# Patient Record
Sex: Female | Born: 2000 | Race: Black or African American | Hispanic: No | Marital: Married | Smoking: Never smoker
Health system: Southern US, Community
[De-identification: ages and names within clinical notes are randomized; demographics above are authoritative.]

## PROBLEM LIST (undated history)

## (undated) DIAGNOSIS — E119 Type 2 diabetes mellitus without complications: Secondary | ICD-10-CM

---
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
## 2019-04-16 ENCOUNTER — Emergency Department (HOSPITAL_COMMUNITY)
Admission: EM | Admit: 2019-04-16 | Discharge: 2019-04-16 | Disposition: A | Discharge status: Home / Self Care | Payer: Self-pay | Attending: Emergency Medicine | Admitting: Emergency Medicine

## 2019-04-16 ENCOUNTER — Other Ambulatory Visit: Payer: Self-pay

## 2019-04-16 ENCOUNTER — Encounter (HOSPITAL_COMMUNITY): Payer: Self-pay

## 2019-04-16 DIAGNOSIS — R102 Pelvic and perineal pain: Secondary | ICD-10-CM | POA: Insufficient documentation

## 2019-04-16 LAB — CBC WITH DIFFERENTIAL/PLATELET
Abs Immature Granulocytes: 0.02 10*3/uL (ref 0.00–0.07)
Basophils Absolute: 0 10*3/uL (ref 0.0–0.1)
Basophils Relative: 1 %
Eosinophils Absolute: 0.2 10*3/uL (ref 0.0–0.5)
Eosinophils Relative: 2 %
HCT: 43.1 % (ref 36.0–46.0)
Hemoglobin: 14.1 g/dL (ref 12.0–15.0)
Immature Granulocytes: 0 %
Lymphocytes Relative: 38 %
Lymphs Abs: 2.8 10*3/uL (ref 0.7–4.0)
MCH: 30 pg (ref 26.0–34.0)
MCHC: 32.7 g/dL (ref 30.0–36.0)
MCV: 91.7 fL (ref 80.0–100.0)
Monocytes Absolute: 0.7 10*3/uL (ref 0.1–1.0)
Monocytes Relative: 10 %
Neutro Abs: 3.5 10*3/uL (ref 1.7–7.7)
Neutrophils Relative %: 49 %
Platelets: 414 10*3/uL — ABNORMAL HIGH (ref 150–400)
RBC: 4.7 MIL/uL (ref 3.87–5.11)
RDW: 13 % (ref 11.5–15.5)
WBC: 7.2 10*3/uL (ref 4.0–10.5)
nRBC: 0 % (ref 0.0–0.2)

## 2019-04-16 LAB — COMPREHENSIVE METABOLIC PANEL
ALT: 13 U/L (ref 0–44)
AST: 20 U/L (ref 15–41)
Albumin: 4.6 g/dL (ref 3.5–5.0)
Alkaline Phosphatase: 63 U/L (ref 38–126)
Anion gap: 9 (ref 5–15)
BUN: 12 mg/dL (ref 6–20)
CO2: 23 mmol/L (ref 22–32)
Calcium: 9.8 mg/dL (ref 8.9–10.3)
Chloride: 105 mmol/L (ref 98–111)
Creatinine, Ser: 0.7 mg/dL (ref 0.44–1.00)
GFR calc Af Amer: >60 mL/min (ref >60)
GFR calc non Af Amer: >60 mL/min (ref >60)
Glucose, Bld: 95 mg/dL (ref 70–99)
Potassium: 3.8 mmol/L (ref 3.5–5.1)
Sodium: 137 mmol/L (ref 135–145)
Total Bilirubin: 0.3 mg/dL (ref 0.3–1.2)
Total Protein: 7.9 g/dL (ref 6.5–8.1)

## 2019-04-16 LAB — URINALYSIS, ROUTINE W REFLEX MICROSCOPIC
Bilirubin Urine: NEGATIVE
Glucose, UA: NEGATIVE mg/dL
Hgb urine dipstick: NEGATIVE
Ketones, ur: NEGATIVE mg/dL
Leukocytes,Ua: NEGATIVE
Nitrite: NEGATIVE
Protein, ur: NEGATIVE mg/dL
Specific Gravity, Urine: 1.025 (ref 1.005–1.030)
pH: 7 (ref 5.0–8.0)

## 2019-04-16 LAB — I-STAT BETA HCG BLOOD, ED (MC, WL, AP ONLY): I-stat hCG, quantitative: <5 m[IU]/mL (ref <5)

## 2019-04-16 NOTE — Discharge Instructions (Signed)
You have been seen today for pelvic pain. Please read and follow all provided instructions. Return to the emergency room for worsening condition or new concerning symptoms.    1. Medications:  No new medications were prescribed.  Recommend you take Tylenol and ibuprofen as needed for pain.  Ibuprofen is helpful with menstrual cramps  Continue usual home medications Take medications as prescribed. Please review all of the medicines and only take them if you do not have an allergy to them.   2. Treatment: rest, drink plenty of fluids  3. Follow Up:  Please follow up the [facility] to establish care with a primary care doctor.  -Also recommend you follow-up with the women's clinic to discuss your irregular periods.  The contact information is included in your paperwork. Please call office to schedule appointment.   It is also a possibility that you have an allergic reaction to any of the medicines that you have been prescribed - Everybody reacts differently to medications and while MOST people have no trouble with most medicines, you may have a reaction such as nausea, vomiting, rash, swelling, shortness of breath. If this is the case, please stop taking the medicine immediately and contact your physician.  ?

## 2019-04-16 NOTE — ED Triage Notes (Signed)
Patient states she is here because she is having irregular periods and having cramping in pelvic region. Patient states she got period in December and January but missed February and now has missed march. Patient denies possibility of being pregnant.

## 2019-04-16 NOTE — ED Provider Notes (Signed)
[facility]-EMERGENCY DEPT Provider Note   CSN: [id number] Arrival date & time: 04/16/19  1704     History Chief Complaint  Patient presents with  . Pelvic Pain    Denise Woodard is a 19 y.o. female presents to emergency department today with chief complaint of intermittent pelvic pain x 2 months.  Patient states she has been having irregular periods. She did not have cycle in December. LMP was January 13 and it was normal for her. She then missed cycle in February and has not yet has cycle this month (March). She endorses clear vaginal discharge which she reports is normal for her, unchanged. She is endorsing what feels like her typical menstrual cramps, but without actual cycle. She is sexually active with one female partner without protection. Denies history of STDs. She denies fever, chills, chest pain, shortness of breath, abnormal vaginal bleeding, diarrhea, pruritis, nausea, vomiting, urinary frequency, dysuria, diarrhea.  History provided by patient with additional history obtained from chart review.     No past medical history on file.  There are no problems to display for this patient.     OB History   No obstetric history on file.     No family history on file.  Social History   Tobacco Use  . Smoking status: Never Smoker  . Smokeless tobacco: Never Used  Substance Use Topics  . Alcohol use: Never  . Drug use: Never    Home Medications Prior to Admission medications   Not on File    Allergies    Amoxicillin and Penicillins  Review of Systems   Review of Systems  All other systems are reviewed and are negative for acute change except as noted in the HPI.   Physical Exam Updated Vital Signs BP (!) 161/82 (BP Location: Left Arm)   Pulse 82   Temp 99 F (37.2 C) (Oral)   Resp 16   Ht 5\' 5"  (1.651 m)   Wt 81.6 kg   LMP 02/16/2019   SpO2 100%   BMI 29.95 kg/m   Physical Exam Vitals and nursing note reviewed.    Constitutional:      General: She is not in acute distress.    Appearance: She is not ill-appearing.  HENT:     Head: Normocephalic and atraumatic.     Right Ear: Tympanic membrane and external ear normal.     Left Ear: Tympanic membrane and external ear normal.     Nose: Nose normal.     Mouth/Throat:     Mouth: Mucous membranes are moist.     Pharynx: Oropharynx is clear.  Eyes:     General: No scleral icterus.       Right eye: No discharge.        Left eye: No discharge.     Extraocular Movements: Extraocular movements intact.     Conjunctiva/sclera: Conjunctivae normal.     Pupils: Pupils are equal, round, and reactive to light.  Neck:     Vascular: No JVD.  Cardiovascular:     Rate and Rhythm: Normal rate and regular rhythm.     Pulses: Normal pulses.          Radial pulses are 2+ on the right side and 2+ on the left side.     Heart sounds: Normal heart sounds.  Pulmonary:     Comments: Lungs clear to auscultation in all fields. Symmetric chest rise. No wheezing, rales, or rhonchi. Abdominal:     General: Bowel  sounds are normal.     Tenderness: There is no left CVA tenderness.     Comments: Abdomen is soft, non-distended. Mild tenderness to suprapubic region. No rigidity, no guarding. No peritoneal signs.  Genitourinary:    Comments: Patient defers pelvic exam Musculoskeletal:        General: Normal range of motion.     Cervical back: Normal range of motion.  Skin:    General: Skin is warm and dry.     Capillary Refill: Capillary refill takes less than 2 seconds.  Neurological:     Mental Status: She is oriented to person, place, and time.     GCS: GCS eye subscore is 4. GCS verbal subscore is 5. GCS motor subscore is 6.     Comments: Fluent speech, no facial droop.  Psychiatric:        Mood and Affect: Mood is anxious.        Behavior: Behavior normal.     ED Results / Procedures / Treatments   Labs (all labs ordered are listed, but only abnormal results  are displayed) Labs Reviewed  CBC WITH DIFFERENTIAL/PLATELET - Abnormal; Notable for the following components:      Result Value   Platelets 414 (*)    All other components within normal limits  URINALYSIS, ROUTINE W REFLEX MICROSCOPIC - Abnormal; Notable for the following components:   APPearance CLOUDY (*)    All other components within normal limits  COMPREHENSIVE METABOLIC PANEL  I-STAT BETA HCG BLOOD, ED ([facility], [facility], [facility] ONLY)    EKG None  Radiology No results found.  Procedures Procedures (including critical care time)  Medications Ordered in ED Medications - No data to display  ED Course  I have reviewed the triage vital signs and the nursing notes.  Pertinent labs & imaging results that were available during my care of the patient were reviewed by me and considered in my medical decision making (see chart for details).    MDM Rules/Calculators/A&P                     Patient seen and examined. Patient presents awake, alert, hemodynamically stable, afebrile, non toxic.  No tachycardia or hypoxia.  On exam she has mild tenderness to suprapubic area, no peritoneal signs.  No CVA tenderness.   Labs here are unremarkable.  No leukocytosis, no anemia, no severe electrolyte derangement, normal renal function, normal liver enzymes.  UA is negative for infection.  Pregnancy test is negative. Patient defers pelvic exam. She has never had pelvic exam before. Discussed that with her complaint of pelvic pain it is reasonable to do pelvic exam and STD testing. Patient refuses and would rather follow up outpatient with pcp. I discussed risks of not having pelvic exam and untreated STDs. She then admits to coming to emergency department for pregnancy test.   The patient appears reasonably screened and/or stabilized for discharge and I doubt any other medical condition or other [facility] requiring further screening, evaluation, or treatment in the ED at this time prior to discharge. The patient is  safe for discharge with strict return precautions discussed. Recommend pcp follow up. Also given information for outpatient womens clinic for follow up.   Portions of this note were generated with Scientist, clinical (histocompatibility and immunogenetics). Dictation errors may occur despite best attempts at proofreading.    Final Clinical Impression(s) / ED Diagnoses Final diagnoses:  Pelvic pain in female    Rx / DC Orders ED Discharge Orders  None       Flint Melter 04/16/19 Tomasita Morrow, MD 04/17/19 1209

## 2019-09-22 ENCOUNTER — Other Ambulatory Visit: Payer: Self-pay

## 2019-09-22 ENCOUNTER — Emergency Department (HOSPITAL_COMMUNITY)
Admission: EM | Admit: 2019-09-22 | Discharge: 2019-09-23 | Disposition: A | Discharge status: Home / Self Care | Payer: Medicaid Other | Attending: Emergency Medicine | Admitting: Emergency Medicine

## 2019-09-22 DIAGNOSIS — Z3A08 8 weeks gestation of pregnancy: Secondary | ICD-10-CM | POA: Insufficient documentation

## 2019-09-22 DIAGNOSIS — O021 Missed abortion: Secondary | ICD-10-CM | POA: Insufficient documentation

## 2019-09-22 DIAGNOSIS — Z5321 Procedure and treatment not carried out due to patient leaving prior to being seen by health care provider: Secondary | ICD-10-CM | POA: Diagnosis not present

## 2019-09-22 NOTE — ED Triage Notes (Signed)
Pt 8 weeks preg on 8/16 was told today via vaginal ultrasound that there is not heart beat. Pregnancy network is seeing this patient and is supposed to be in contact within 24 hrs.She states "I am nervous about walking around with a dead baby inside of me"  PA Jeraldine Loots to assess at triage.

## 2019-09-22 NOTE — ED Triage Notes (Signed)
Emergency Medicine Provider OB Triage Evaluation Note  Denise Woodard is a 19 y.o. female, No obstetric history on file., at Unknown gestation who presents to the emergency department with complaints of concerns for abnormal ultrasound.  She reports that today she had a vaginal ultrasound with a pregnancy network of [place].  First day of her LMP was 4/14.  She is G1, P0.  She reports that at the ultrasound they saw a baby present however noted that there was no heartbeat.  She states that she was told they would contact her within 24 hours to set up follow-up appointments as an outpatient however she is very concerned that she is.  "Walking around with a dead baby inside of me."  She denies any pelvic pain, vaginal discharge, spotting, or bleeding.  No fevers.  No nausea vomiting diarrhea and is entirely asymptomatic aside from being appropriately emotionally upset.   Review of  Systems  Positive: None Negative: No vaginal cramping, spotting or bleeding.  No fevers.  Physical Exam  BP (!) 157/124    Pulse (!) 103    Temp 98.8 F (37.1 C)    Resp 19    SpO2 100%  General: Awake, appropriately tearful, anxious HEENT: Atraumatic  Resp: Normal effort  Cardiac: Normal rate Abd: Nondistended, nontender  MSK: Moves all extremities without difficulty Neuro: Speech clear  Medical Decision Making  Pt evaluated for pregnancy concern and is stable for transfer to [facility]. Pt is in agreement with plan for transfer.  9:24 PM Discussed with [facility] APP, Joni Reining, who reccomends patient follow up as an outpatient stating there is no emergent complaint.  She [facility] to follow-up on abnormal ultrasound. Recommends patient be discharged with information for the outpatient clinic.   I disucssed with patient that [facility] is not accepting her at this time.  I recommended that she stay for complete evaluation in the ER as this triage amazing  exam is not a substitute for a full formal evaluation and we discussed risks of leaving before full formal evaluation is complete.  She stated her understanding.    Clinical Impression  No diagnosis found.     Cristina Gong, [place] 09/22/19 2125

## 2019-09-23 ENCOUNTER — Other Ambulatory Visit: Payer: Self-pay

## 2019-09-23 ENCOUNTER — Telehealth: Payer: Self-pay

## 2019-09-23 DIAGNOSIS — O3680X1 Pregnancy with inconclusive fetal viability, fetus 1: Secondary | ICD-10-CM

## 2019-09-23 NOTE — Telephone Encounter (Signed)
error 

## 2019-09-23 NOTE — Telephone Encounter (Signed)
Patient states she is about [redacted] weeks pregnant. States she had irregular periods and the start of the last LMP was 05/18/2019. States she went to the pregnancy network center states her mom told her to go to the ER because she did not need to be walking around with a "dead baby" in her. Advised that this is not an urgent matter and that we will be taking care of things for her and this will take some time to access her needs and that is starting her off with an ultrasound. She verbalized understanding and I advised I would call her back once I got it all figured out for her.

## 2019-09-26 ENCOUNTER — Other Ambulatory Visit: Payer: Self-pay

## 2019-09-26 ENCOUNTER — Ambulatory Visit
Admission: RE | Admit: 2019-09-26 | Discharge: 2019-09-26 | Disposition: A | Discharge status: Home / Self Care | Payer: Medicaid Other | Source: Ambulatory Visit | Attending: Family Medicine | Admitting: Family Medicine

## 2019-09-26 ENCOUNTER — Ambulatory Visit (INDEPENDENT_AMBULATORY_CARE_PROVIDER_SITE_OTHER): Payer: Self-pay | Admitting: Family Medicine

## 2019-09-26 VITALS — BP 157/81 | HR 93 | Ht 65.0 in | Wt 204.0 lb

## 2019-09-26 DIAGNOSIS — O3680X1 Pregnancy with inconclusive fetal viability, fetus 1: Secondary | ICD-10-CM | POA: Diagnosis not present

## 2019-09-26 DIAGNOSIS — O021 Missed abortion: Secondary | ICD-10-CM

## 2019-09-26 MED ORDER — OXYCODONE-ACETAMINOPHEN 5-325 MG PO TABS: 1.0000 | ORAL_TABLET | ORAL | 0 refills | Status: DC | PRN

## 2019-09-26 MED ORDER — MISOPROSTOL 200 MCG PO TABS: ORAL_TABLET | ORAL | 1 refills | Status: DC

## 2019-09-26 NOTE — Progress Notes (Signed)
   Subjective:    Patient ID: Denise Woodard, female    DOB: Jan 21, 2001, 19 y.o.   MRN: [id number]  HPI Patient seen for [redacted]w[redacted]d MAB. No bleeding, spotting, cramping. This was a planned pregnancy.   Review of Systems     Objective:   Physical Exam Vitals reviewed.  Constitutional:      Appearance: Normal appearance.  HENT:     Head: Normocephalic and atraumatic.  Abdominal:     General: Abdomen is flat.  Skin:    Capillary Refill: Capillary refill takes less than 2 seconds.  Neurological:     Mental Status: She is alert.  Psychiatric:        Mood and Affect: Mood normal.        Behavior: Behavior normal.        Thought Content: Thought content normal.        Judgment: Judgment normal.       Assessment & Plan:  1. Missed ab [redacted]w[redacted]d MAB. Discussed options. Will give cytotec and percocet. Discussed risks, pain/cramping at home. Repeat once in 48 hours if doesn't pass tissue. F/u in 2 weeks.

## 2019-10-12 ENCOUNTER — Ambulatory Visit: Payer: Self-pay | Admitting: Obstetrics and Gynecology

## 2019-10-13 ENCOUNTER — Telehealth: Payer: Self-pay | Admitting: Family Medicine

## 2019-10-13 NOTE — Telephone Encounter (Signed)
Attempted to reschedule patient appointment from 09/08 because of power failure. 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 When given an appointment she suggested her appointment be canceled. She stated she no longer felt the need to be seen anyway.

## 2020-06-03 ENCOUNTER — Encounter (HOSPITAL_COMMUNITY): Payer: Self-pay | Admitting: Emergency Medicine

## 2020-06-03 ENCOUNTER — Other Ambulatory Visit: Payer: Self-pay

## 2020-06-03 ENCOUNTER — Emergency Department (HOSPITAL_COMMUNITY)
Admission: EM | Admit: 2020-06-03 | Discharge: 2020-06-03 | Disposition: A | Discharge status: Home / Self Care | Payer: Medicaid Other | Attending: Emergency Medicine | Admitting: Emergency Medicine

## 2020-06-03 DIAGNOSIS — I10 Essential (primary) hypertension: Secondary | ICD-10-CM | POA: Diagnosis not present

## 2020-06-03 DIAGNOSIS — J029 Acute pharyngitis, unspecified: Secondary | ICD-10-CM | POA: Insufficient documentation

## 2020-06-03 DIAGNOSIS — N939 Abnormal uterine and vaginal bleeding, unspecified: Secondary | ICD-10-CM | POA: Insufficient documentation

## 2020-06-03 DIAGNOSIS — R509 Fever, unspecified: Secondary | ICD-10-CM

## 2020-06-03 DIAGNOSIS — R070 Pain in throat: Secondary | ICD-10-CM | POA: Diagnosis present

## 2020-06-03 DIAGNOSIS — Z20822 Contact with and (suspected) exposure to covid-19: Secondary | ICD-10-CM | POA: Diagnosis not present

## 2020-06-03 DIAGNOSIS — E119 Type 2 diabetes mellitus without complications: Secondary | ICD-10-CM | POA: Insufficient documentation

## 2020-06-03 HISTORY — DX: Type 2 diabetes mellitus without complications: E11.9

## 2020-06-03 LAB — URINALYSIS, ROUTINE W REFLEX MICROSCOPIC
Bilirubin Urine: NEGATIVE
Glucose, UA: NEGATIVE mg/dL
Ketones, ur: NEGATIVE mg/dL
Leukocytes,Ua: NEGATIVE
Nitrite: NEGATIVE
Protein, ur: 100 mg/dL — AB
RBC / HPF: >50 RBC/hpf — ABNORMAL HIGH (ref 0–5)
Specific Gravity, Urine: 1.027 (ref 1.005–1.030)
pH: 5 (ref 5.0–8.0)

## 2020-06-03 LAB — RESP PANEL BY RT-PCR (FLU A&B, COVID) ARPGX2
Influenza A by PCR: NEGATIVE
Influenza B by PCR: NEGATIVE
SARS Coronavirus 2 by RT PCR: NEGATIVE

## 2020-06-03 LAB — PREGNANCY, URINE: Preg Test, Ur: NEGATIVE

## 2020-06-03 LAB — GROUP A STREP BY PCR: Group A Strep by PCR: NOT DETECTED

## 2020-06-03 MED ORDER — IBUPROFEN 600 MG PO TABS: 600.0000 mg | ORAL_TABLET | Freq: Four times a day (QID) | ORAL | 0 refills | Status: DC | PRN

## 2020-06-03 MED ORDER — ACETAMINOPHEN 325 MG PO TABS
650.0000 mg | ORAL_TABLET | Freq: Once | ORAL | Status: AC
Start: 2020-06-03 — End: 2020-06-03
  Administered 2020-06-03: 650 mg via ORAL
  Filled 2020-06-03: qty 2

## 2020-06-03 MED ORDER — CLINDAMYCIN HCL 150 MG PO CAPS: 300.0000 mg | ORAL_CAPSULE | Freq: Three times a day (TID) | ORAL | 0 refills | Status: AC

## 2020-06-03 MED ORDER — IBUPROFEN 800 MG PO TABS
800.0000 mg | ORAL_TABLET | Freq: Once | ORAL | Status: AC
  Administered 2020-06-03: 800 mg via ORAL
  Filled 2020-06-03: qty 1

## 2020-06-03 MED ORDER — ACETAMINOPHEN 325 MG PO TABS: 650.0000 mg | ORAL_TABLET | Freq: Four times a day (QID) | ORAL | 0 refills | Status: AC | PRN

## 2020-06-03 NOTE — ED Triage Notes (Signed)
Emergency Medicine Provider Triage Evaluation Note  Denise Woodard , a 20 y.o. female  was evaluated in triage.  Pt complains of sore throat, fever, vaginal bleeding x 2 days. Tmax 101, took tylenol yesterday. LMP 05/14/20.  Had abdominal cramping yesterday, none today.  Review of Systems  Positive: Sore throat, fever, vaginal bleeding Negative: Cough, congestion, urinary symptoms, vaginal discharge  Physical Exam  BP (!) 152/109   Pulse (!) 117   Temp (!) 101.3 F (38.5 C) (Oral)   Resp 18   LMP 05/14/2020   SpO2 95%  Gen:   Awake, no distress   HEENT:  Atraumatic. Bilateral tonsillar exudate with mild Resp:  Normal effort  Cardiac:  tachycardic Abd:   Nondistended, nontender  MSK:   Moves extremities without difficulty  Neuro:  Speech clear   Medical Decision Making  Medically screening exam initiated at 1:15 PM.  Appropriate orders placed.  Arthur Aydelotte was informed that the remainder of the evaluation will be completed by another provider, this initial triage assessment does not replace that evaluation, and the importance of remaining in the ED until their evaluation is complete.  Clinical Impression  Fever, vaginal bleeding, sore throat. covid swab and strep ordered as well as urine preg and UA. Tylenol ordered.   Shanon Ace, PA-C 06/03/20 1318

## 2020-06-03 NOTE — Discharge Instructions (Signed)
You may be having a virus causing your symptoms.  I would advise that you use Tylenol and Motrin as prescribed for your pain and sore throat.  If you are not feeling better in 5 days, you can begin taking the antibiotic that I prescribed.  This is a 10-day course that will treat strep throat (bacterial).  We talked about your vaginal bleeding.  You did not want a pelvic exam at this time to look for signs of infection. I advised that you follow-up with your OB/GYN doctor for this issue.

## 2020-06-03 NOTE — ED Notes (Signed)
Provider at the bedside to evaluate. 

## 2020-06-03 NOTE — ED Triage Notes (Signed)
Patient reports fever and sore throat since attending a party x2 days ago. Also c/o irregular vaginal bleeding x2 days. States LMP 4/11.

## 2020-06-03 NOTE — ED Provider Notes (Signed)
[facility]-EMERGENCY DEPT Provider Note   CSN: [id number] Arrival date & time: 06/03/20  1217     History Chief Complaint  Patient presents with  . Fever  . Sore Throat  . Vaginal Bleeding    Denise Woodard is a 20 y.o. female with a history of obesity, diabetes, hypertension, presented to emergency department sore throat, fever, headache for 2 days.  She also reports a dry cough.  She separately reports that she has had some vaginal bleeding and spotting intermittently, with her last menstrual period 3 weeks ago.  She says she has irregular menses.  She had birth control place for 2 days "a ring in my vagina," but removed it after 2 days.  She wonders whether this may be triggering some bleeding.  She adamantly denies vaginal discharge.  She states she was recently tested for STIs and these were all negative.  She does not want a pelvic exam today.  HPI     Past Medical History:  Diagnosis Date  . Diabetes mellitus without complication (HCC)     There are no problems to display for this patient.   History reviewed. No pertinent surgical history.   OB History   No obstetric history on file.     No family history on file.  Social History   Tobacco Use  . Smoking status: Never Smoker  . Smokeless tobacco: Never Used  Vaping Use  . Vaping Use: Never used  Substance Use Topics  . Alcohol use: Never  . Drug use: Never    Home Medications Prior to Admission medications   Medication Sig Start Date End Date Taking? Authorizing Provider  acetaminophen (TYLENOL) 325 MG tablet Take 2 tablets (650 mg total) by mouth every 6 (six) hours as needed for up to 30 doses for moderate pain or headache. 06/03/20  Yes Alger Kerstein, Kermit Balo, MD  clindamycin (CLEOCIN) 150 MG capsule Take 2 capsules (300 mg total) by mouth 3 (three) times daily for 10 days. 06/03/20 06/13/20 Yes Kristee Angus, Kermit Balo, MD  ibuprofen (ADVIL) 600 MG tablet Take 1 tablet (600 mg total) by mouth every  6 (six) hours as needed for up to 30 doses for mild pain or moderate pain. 06/03/20  Yes Terald Sleeper, MD  misoprostol (CYTOTEC) 200 MCG tablet Place four tablets in between your gums and cheeks (two tablets on each side) as instructed 09/26/19   Levie Heritage, DO  oxyCODONE-acetaminophen (PERCOCET/ROXICET) 5-325 MG tablet Take 1 tablet by mouth every 4 (four) hours as needed for severe pain. 09/26/19   Levie Heritage, DO    Allergies    Amoxicillin and Penicillins  Review of Systems   Review of Systems  Constitutional: Positive for chills and fever.  HENT: Positive for rhinorrhea and sore throat.   Eyes: Negative for pain and visual disturbance.  Respiratory: Positive for cough and shortness of breath.   Cardiovascular: Negative for chest pain and palpitations.  Gastrointestinal: Negative for abdominal pain and vomiting.  Genitourinary: Positive for vaginal bleeding. Negative for difficulty urinating and dysuria.  Musculoskeletal: Negative for arthralgias and back pain.  Skin: Negative for color change and rash.  Neurological: Positive for headaches. Negative for syncope.  All other systems reviewed and are negative.   Physical Exam Updated Vital Signs BP (!) 140/91 (BP Location: Right Arm)   Pulse 95   Temp 99.6 F (37.6 C) (Oral)   Resp 18   LMP 05/14/2020   SpO2 100%   Physical Exam  Constitutional:      General: She is not in acute distress. HENT:     Head: Normocephalic and atraumatic.     Mouth/Throat:     Pharynx: Uvula midline. Posterior oropharyngeal erythema present.     Tonsils: Tonsillar exudate present. No tonsillar abscesses.     Comments: Cervical lymphadenopathy Eyes:     Conjunctiva/sclera: Conjunctivae normal.     Pupils: Pupils are equal, round, and reactive to light.  Cardiovascular:     Rate and Rhythm: Normal rate and regular rhythm.     Heart sounds: Normal heart sounds.  Pulmonary:     Effort: Pulmonary effort is normal. No respiratory  distress.  Abdominal:     General: There is no distension.     Tenderness: There is no abdominal tenderness.  Skin:    General: Skin is warm and dry.  Neurological:     General: No focal deficit present.     Mental Status: She is alert. Mental status is at baseline.  Psychiatric:        Mood and Affect: Mood normal.        Behavior: Behavior normal.     ED Results / Procedures / Treatments   Labs (all labs ordered are listed, but only abnormal results are displayed) Labs Reviewed  URINALYSIS, ROUTINE W REFLEX MICROSCOPIC - Abnormal; Notable for the following components:      Result Value   Color, Urine RED (*)    APPearance CLOUDY (*)    Hgb urine dipstick LARGE (*)    Protein, ur 100 (*)    RBC / HPF >50 (*)    Bacteria, UA FEW (*)    All other components within normal limits  GROUP A STREP BY PCR  RESP PANEL BY RT-PCR (FLU A&B, COVID) [id number]  PREGNANCY, URINE    EKG None  Radiology No results found.  Procedures Procedures   Medications Ordered in ED Medications  acetaminophen (TYLENOL) tablet 650 mg (650 mg Oral Given 06/03/20 1324)  ibuprofen (ADVIL) tablet 800 mg (800 mg Oral Given 06/03/20 1607)    ED Course  I have reviewed the triage vital signs and the nursing notes.  Pertinent labs & imaging results that were available during my care of the patient were reviewed by me and considered in my medical decision making (see chart for details).  Patient is here with sore throat, headache, fever, cervical lymphadenopathy for 2 days.  She does have tonsillar exudates on exam.  We discussed prescription for strep throat, although her rapid strep is negative, I am not certain this is a conclusive test.  We have opted for a watch and wait approach.  I will provide her prescription - if she is not feeling better in 3 to 5 days she began taking these antibiotics.  She ha only taken a single dose of Advil at home.  We discussed NSAIDs and tylenol for pain at  home.  Regarding her vaginal bleeding, this may be related to her irregular menses, as well as discontinuation of her birth control.  Her UA does not show any clear signs of infection and she does not have dysuria.  She deferred pelvic exam at this time and feels she is low risk for STI.   She would prefer to follow up with her OBGYN for this issue.  PCN allergies - will prescribe clindamycin  Covid/flu negative.  Preg negative. UA with blood, negative leuks and nitrites     Final Clinical Impression(s) / ED Diagnoses  Final diagnoses:  Sore throat  Fever, unspecified fever cause  Vaginal bleeding    Rx / DC Orders ED Discharge Orders         Ordered    ibuprofen (ADVIL) 600 MG tablet  Every 6 hours PRN        06/03/20 1548    acetaminophen (TYLENOL) 325 MG tablet  Every 6 hours PRN        06/03/20 1548    clindamycin (CLEOCIN) 150 MG capsule  3 times daily        06/03/20 1548           Terald Sleeper, MD 06/03/20 2306

## 2022-02-19 IMAGING — US US OB < 14 WEEKS - US OB TV
1 series · 15 of 28 positions shown · non-contrast
Comparison: None

CLINICAL DATA: Pregnancy of uncertain fetal viability

EXAM:
OBSTETRIC <14 WK US AND TRANSVAGINAL OB US
TECHNIQUE: Both transabdominal and transvaginal ultrasound examinations were
performed for complete evaluation of the gestation as well as the
maternal uterus, adnexal regions, and pelvic cul-de-sac.
Transvaginal technique was performed to assess early pregnancy.

[Series 1: us ob < 14 weeks - us ob tv · 48 acquisitions, 15 frames shown]
[im 1/48]
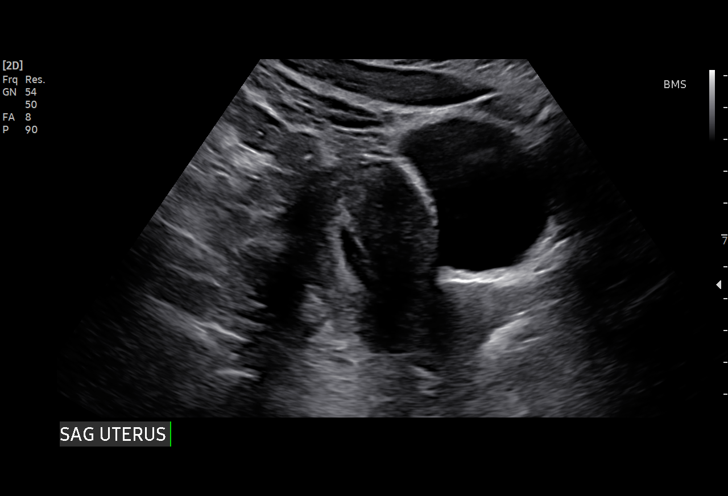
[im 4/48]
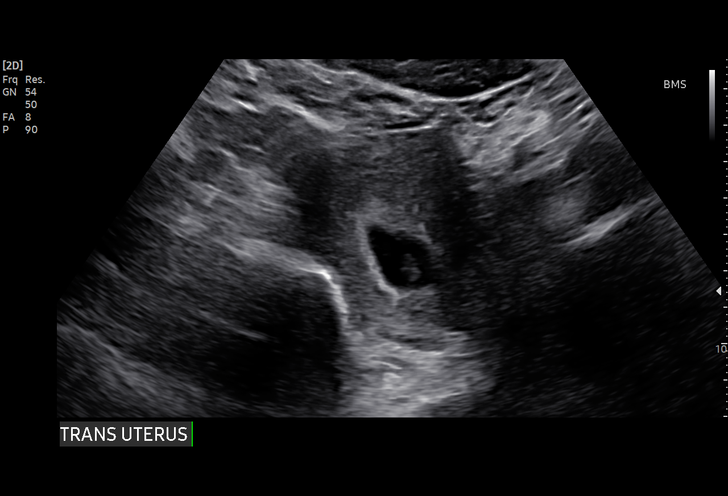
[im 7/48]
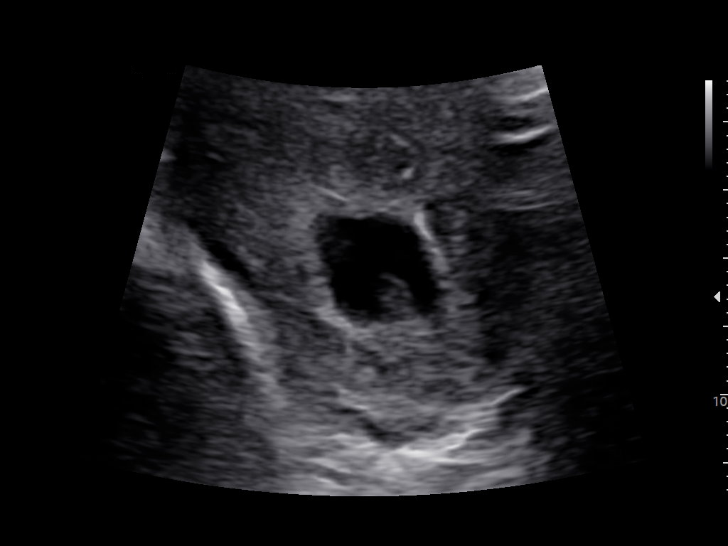
[im 11/48]
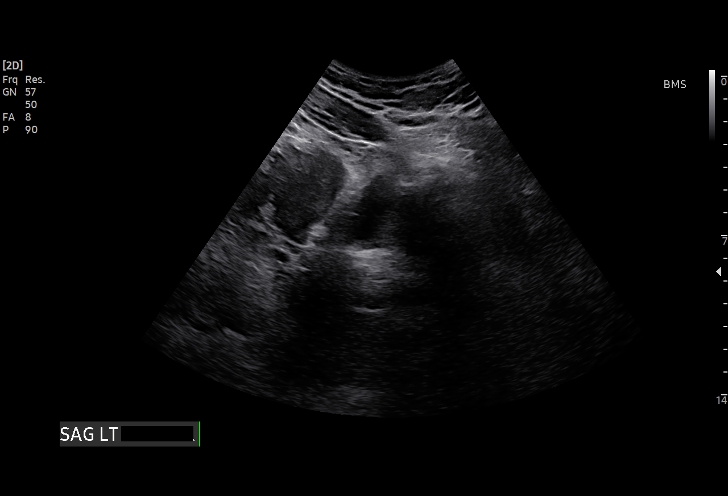
[im 14/48]
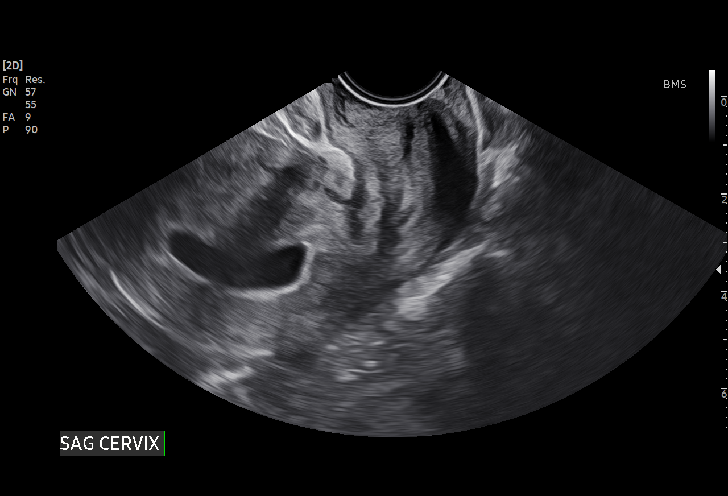
[im 18/48]
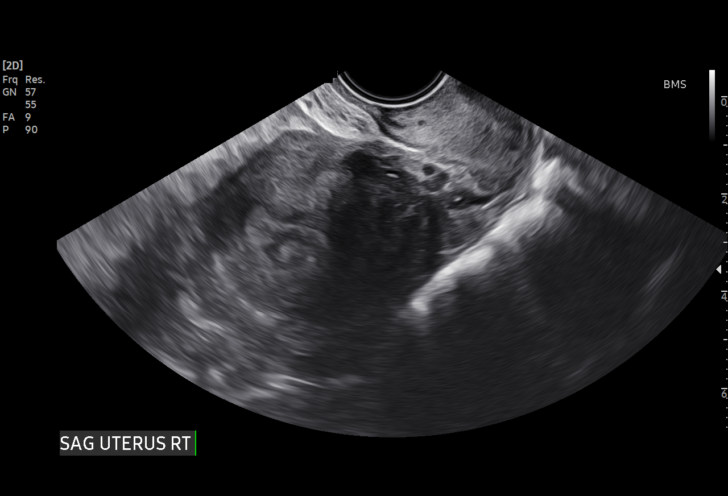
[im 21/48]
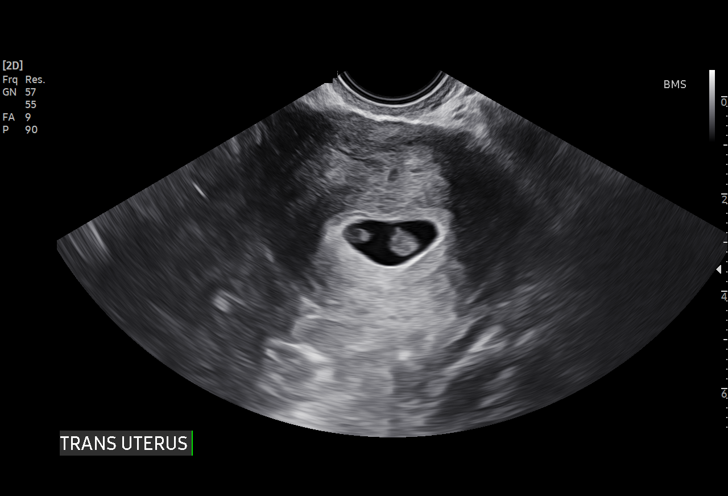
[im 25/48]
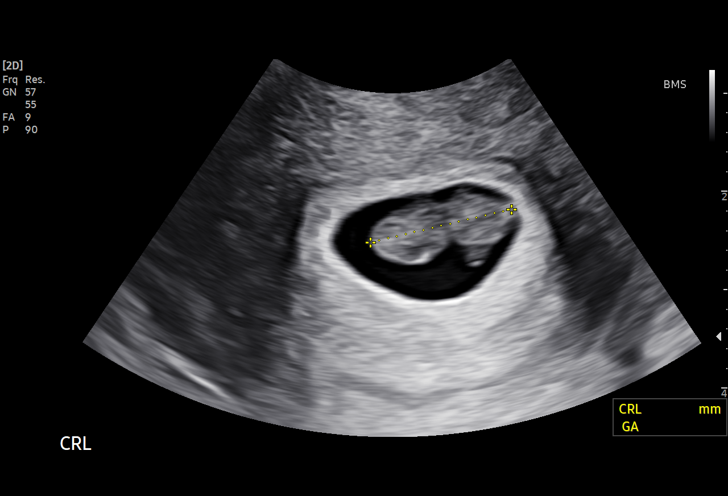
[im 27/48]
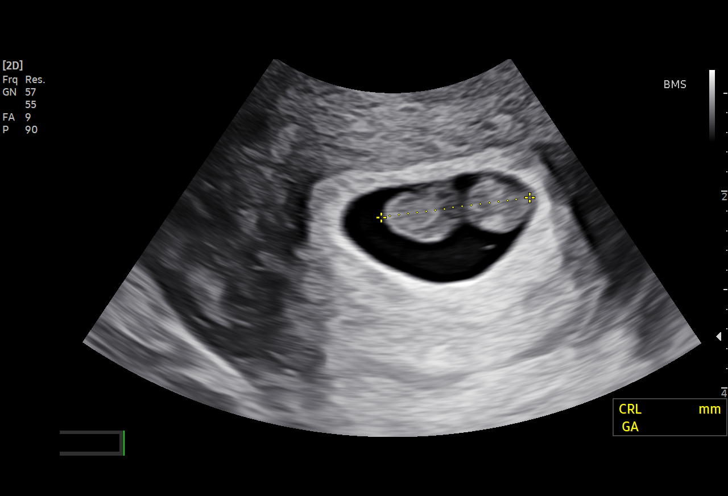
[im 30/48]
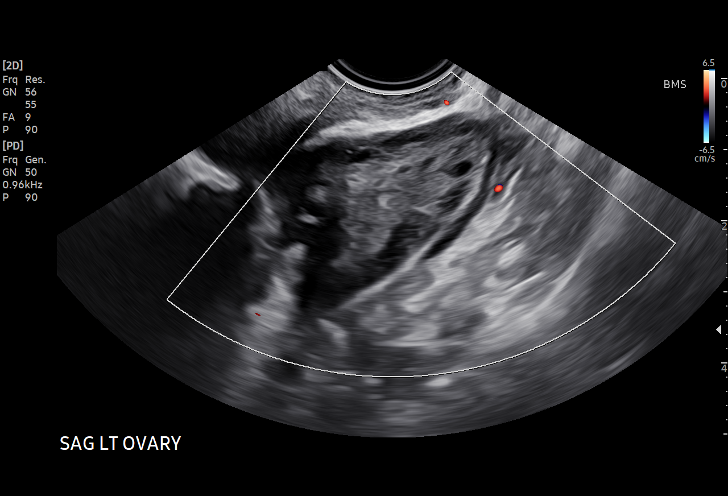
[im 34/48]
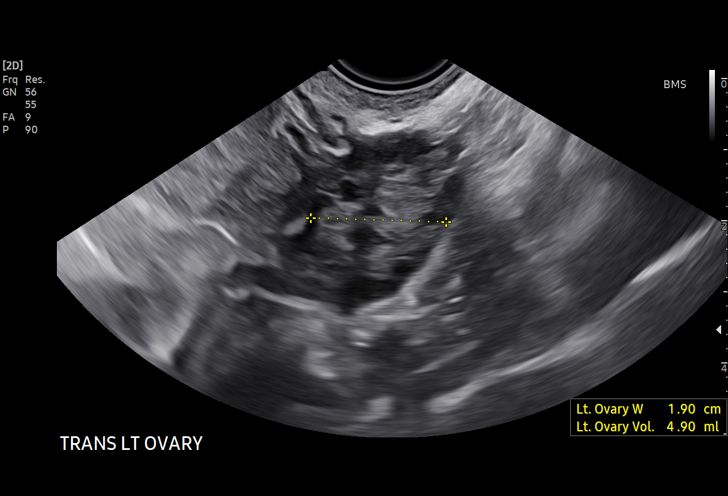
[im 37/48]
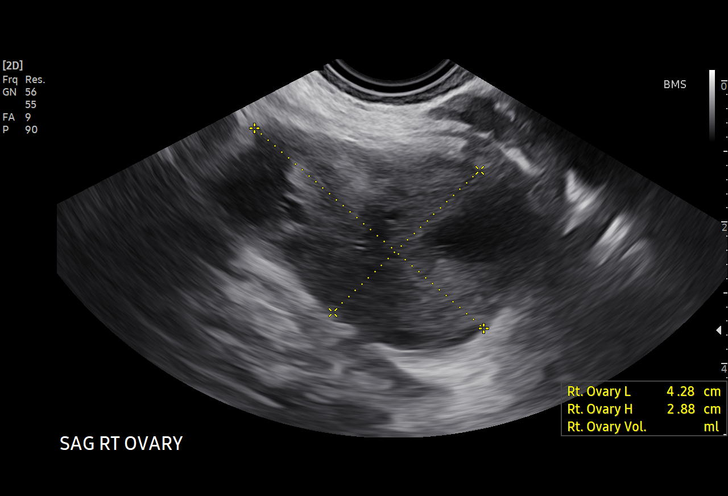
[im 41/48]
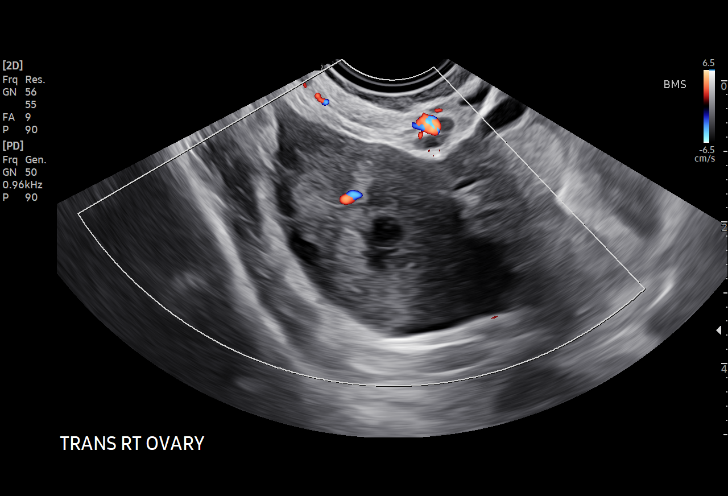
[im 44/48]
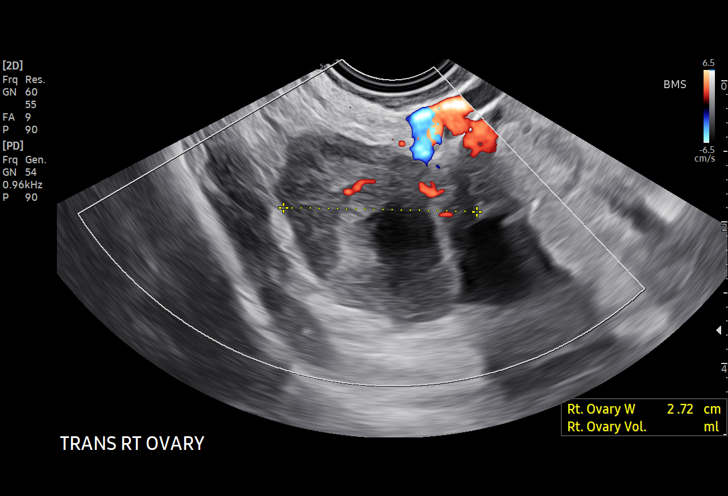
[im 48/48]
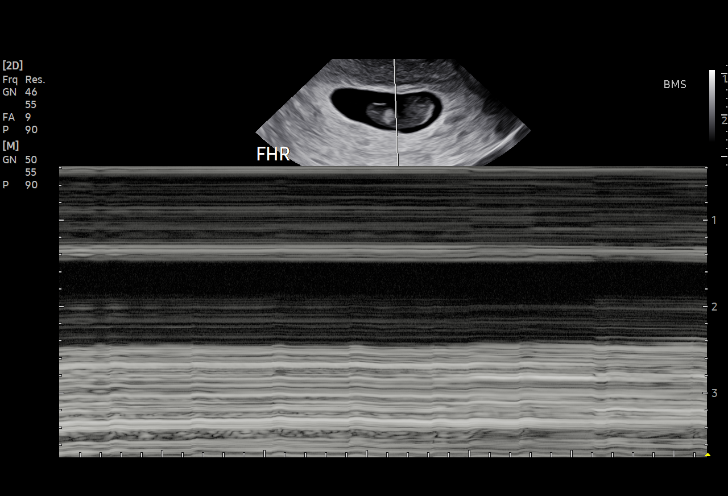

[15 of 28 positions shown; findings below may reference images not displayed]

FINDINGS: Intrauterine gestational sac: Present, single

Yolk sac:  Not identified

Embryo:  Present

Cardiac Activity: Not identified

Heart Rate: N/A  bpm

CRL:  14.7 mm   7 w   6 d                  US EDC: 05/08/2020

Subchorionic hemorrhage:  None visualized.

Maternal uterus/adnexae:

Uterus anteverted, otherwise unremarkable.

LEFT ovary normal size and morphology, 1.9 x 3.6 x 1.4 cm.

RIGHT ovary measures 4.3 x 2.9 x 2.7 cm and contains a small corpus
luteum.

Small amount of nonspecific free pelvic fluid.

No adnexal masses.
IMPRESSION: Intrauterine gestational sac identified containing a fetal pole
measuring 14.7 mm CRL.

Absent fetal cardiac activity.

Findings meet definitive criteria for failed pregnancy. This follows
SRU consensus guidelines: Diagnostic Criteria for Nonviable
Pregnancy Early in the First Trimester. N Engl J Med

## 2022-07-31 ENCOUNTER — Encounter (HOSPITAL_COMMUNITY): Payer: Self-pay | Admitting: Obstetrics and Gynecology

## 2022-07-31 ENCOUNTER — Inpatient Hospital Stay (HOSPITAL_COMMUNITY): Payer: BC Managed Care – PPO

## 2022-07-31 ENCOUNTER — Other Ambulatory Visit: Payer: Self-pay

## 2022-07-31 ENCOUNTER — Inpatient Hospital Stay (HOSPITAL_COMMUNITY)
Admission: AD | Admit: 2022-07-31 | Discharge: 2022-07-31 | Disposition: A | Discharge status: Home / Self Care | Payer: BC Managed Care – PPO | Attending: Obstetrics and Gynecology | Admitting: Obstetrics and Gynecology

## 2022-07-31 DIAGNOSIS — O209 Hemorrhage in early pregnancy, unspecified: Secondary | ICD-10-CM | POA: Insufficient documentation

## 2022-07-31 DIAGNOSIS — N83202 Unspecified ovarian cyst, left side: Secondary | ICD-10-CM | POA: Insufficient documentation

## 2022-07-31 DIAGNOSIS — O3481 Maternal care for other abnormalities of pelvic organs, first trimester: Secondary | ICD-10-CM | POA: Insufficient documentation

## 2022-07-31 DIAGNOSIS — Z3A08 8 weeks gestation of pregnancy: Secondary | ICD-10-CM | POA: Insufficient documentation

## 2022-07-31 DIAGNOSIS — N83209 Unspecified ovarian cyst, unspecified side: Secondary | ICD-10-CM

## 2022-07-31 DIAGNOSIS — O3680X0 Pregnancy with inconclusive fetal viability, not applicable or unspecified: Secondary | ICD-10-CM

## 2022-07-31 LAB — URINALYSIS, ROUTINE W REFLEX MICROSCOPIC
Bilirubin Urine: NEGATIVE
Glucose, UA: >=500 mg/dL — AB
Hgb urine dipstick: NEGATIVE
Ketones, ur: 5 mg/dL — AB
Leukocytes,Ua: NEGATIVE
Nitrite: NEGATIVE
Protein, ur: NEGATIVE mg/dL
Specific Gravity, Urine: 1.028 (ref 1.005–1.030)
pH: 5 (ref 5.0–8.0)

## 2022-07-31 LAB — WET PREP, GENITAL
Clue Cells Wet Prep HPF POC: NONE SEEN
Sperm: NONE SEEN
Trich, Wet Prep: NONE SEEN
WBC, Wet Prep HPF POC: <10 (ref <10)
Yeast Wet Prep HPF POC: NONE SEEN

## 2022-07-31 LAB — CBC
HCT: 39.6 % (ref 36.0–46.0)
Hemoglobin: 13.4 g/dL (ref 12.0–15.0)
MCH: 31.1 pg (ref 26.0–34.0)
MCHC: 33.8 g/dL (ref 30.0–36.0)
MCV: 91.9 fL (ref 80.0–100.0)
Platelets: 382 10*3/uL (ref 150–400)
RBC: 4.31 MIL/uL (ref 3.87–5.11)
RDW: 13 % (ref 11.5–15.5)
WBC: 12.4 10*3/uL — ABNORMAL HIGH (ref 4.0–10.5)
nRBC: 0 % (ref 0.0–0.2)

## 2022-07-31 LAB — POCT PREGNANCY, URINE: Preg Test, Ur: POSITIVE — AB

## 2022-07-31 LAB — ABO/RH: ABO/RH(D): O POS

## 2022-07-31 LAB — HCG, QUANTITATIVE, PREGNANCY: hCG, Beta Chain, Quant, S: 49927 m[IU]/mL — ABNORMAL HIGH (ref <5)

## 2022-07-31 NOTE — MAU Provider Note (Signed)
Chief Complaint: Cyst   Event Date/Time   First Provider Initiated Contact with Patient 07/31/22 2129        SUBJECTIVE HPI: Denise Woodard is a 22 y.o. G1P0 at [redacted]w[redacted]d by LMP who presents to maternity admissions reporting spotting in pregnancy.  Was seen somewhere outside of [facility] for an [place] and was told there was a mass beside the baby. Wants to see what that is (was told to come here). She denies urinary symptoms, h/a, dizziness, n/v, or fever/chills.    Vaginal Bleeding The patient's primary symptoms include vaginal bleeding. The patient's pertinent negatives include no genital odor or pelvic pain. This is a new problem. The patient is experiencing no pain. She is pregnant. Pertinent negatives include no abdominal pain, chills or fever. The vaginal discharge was bloody. The vaginal bleeding is spotting. She has not been passing clots. She has not been passing tissue. Nothing aggravates the symptoms.   RN Note: .Denise Woodard is a 22 y.o. at [redacted]w[redacted]d here in MAU reporting: went for an [facility] earlier [facility] and they saw a mass next to baby. States she has had spotting since 6/2 - was told by the nurse to come here for evaluation. Denies pain. States spotting comes and goes - had some yesterday but none today.                     LMP: 06/05/2022            Pain score: 0  Past Medical History:  Diagnosis Date   Diabetes mellitus without complication (HCC)    No past surgical history on file. Social History   Socioeconomic History   Marital status: Married    Spouse name: Not on file   Number of children: Not on file   Years of education: Not on file   Highest education level: Not on file  Occupational History   Not on file  Tobacco Use   Smoking status: Never   Smokeless tobacco: Never  Vaping Use   Vaping Use: Never used  Substance and Sexual Activity   Alcohol use: Never   Drug use: Never   Sexual activity: Yes    Birth control/protection: None  Other  Topics Concern   Not on file  Social History Narrative   Not on file   Social Determinants of Health   Financial Resource Strain: Not on file  Food Insecurity: Not on file  Transportation Needs: Not on file  Physical Activity: Not on file  Stress: Not on file  Social Connections: Not on file  Intimate Partner Violence: Not on file   No current facility-administered medications on file prior to encounter.   Current Outpatient Medications on File Prior to Encounter  Medication Sig Dispense Refill   acetaminophen (TYLENOL) 325 MG tablet Take 2 tablets (650 mg total) by mouth every 6 (six) hours as needed for up to 30 doses for moderate pain or headache. 30 tablet 0   ibuprofen (ADVIL) 600 MG tablet Take 1 tablet (600 mg total) by mouth every 6 (six) hours as needed for up to 30 doses for mild pain or moderate pain. 30 tablet 0   misoprostol (CYTOTEC) 200 MCG tablet Place four tablets in between your gums and cheeks (two tablets on each side) as instructed 4 tablet 1   oxyCODONE-acetaminophen (PERCOCET/ROXICET) 5-325 MG tablet Take 1 tablet by mouth every 4 (four) hours as needed for severe pain. 15 tablet 0   Allergies  Allergen Reactions   Amoxicillin Other (See Comments)   Penicillins Other (See Comments)    I have reviewed patient's Past Medical Hx, Surgical Hx, Family Hx, Social Hx, medications and allergies.   ROS:  Review of Systems  Constitutional:  Negative for chills and fever.  Gastrointestinal:  Negative for abdominal pain.  Genitourinary:  Positive for vaginal bleeding. Negative for pelvic pain.   Review of Systems  Other systems negative   Physical Exam  Physical Exam Patient Vitals for the past 24 hrs:  BP Temp Temp src Pulse Resp SpO2 Height Weight  07/31/22 2048 (!) 148/76 99.1 F (37.3 C) Oral 78 18 98 % 5\' 6"  (1.676 m) 95.5 kg   Constitutional: Well-developed, well-nourished female in no acute distress.  Cardiovascular: normal rate Respiratory: normal  effort GI: Abd soft, non-tender.  MS: Extremities nontender, no edema, normal ROM Neurologic: Alert and oriented x 4.  GU: Neg CVAT.  PELVIC EXAM: deferred in lieu of ultrasound  LAB RESULTS Results for orders placed or performed during the hospital encounter of 07/31/22 (from the past 24 hour(s))  Urinalysis, Routine w reflex microscopic -Urine, Clean Catch     Status: Abnormal   Collection Time: 07/31/22  8:45 PM  Result Value Ref Range   Color, Urine YELLOW YELLOW   APPearance CLEAR CLEAR   Specific Gravity, Urine 1.028 1.005 - 1.030   pH 5.0 5.0 - 8.0   Glucose, UA >=500 (A) NEGATIVE mg/dL   Hgb urine dipstick NEGATIVE NEGATIVE   Bilirubin Urine NEGATIVE NEGATIVE   Ketones, ur 5 (A) NEGATIVE mg/dL   Protein, ur NEGATIVE NEGATIVE mg/dL   Nitrite NEGATIVE NEGATIVE   Leukocytes,Ua NEGATIVE NEGATIVE   RBC / HPF 0-5 0 - 5 RBC/hpf   WBC, UA 0-5 0 - 5 WBC/hpf   Bacteria, UA RARE (A) NONE SEEN   Squamous Epithelial / HPF 0-5 0 - 5 /HPF   Mucus PRESENT   Pregnancy, urine POC     Status: Abnormal   Collection Time: 07/31/22  8:58 PM  Result Value Ref Range   Preg Test, Ur POSITIVE (A) NEGATIVE  Wet prep, genital     Status: None   Collection Time: 07/31/22  9:14 PM  Result Value Ref Range   Yeast Wet Prep HPF POC NONE SEEN NONE SEEN   Trich, Wet Prep NONE SEEN NONE SEEN   Clue Cells Wet Prep HPF POC NONE SEEN NONE SEEN   WBC, Wet Prep HPF POC <10 <10   Sperm NONE SEEN   ABO/Rh     Status: None   Collection Time: 07/31/22  9:21 PM  Result Value Ref Range   ABO/RH(D) O POS    No rh immune globuloin      NOT A RH IMMUNE GLOBULIN CANDIDATE, PT RH POSITIVE Performed at [facility], [address], [place], [place] [address]   CBC     Status: Abnormal   Collection Time: 07/31/22  9:24 PM  Result Value Ref Range   WBC 12.4 (H) 4.0 - 10.5 K/uL   RBC 4.31 3.87 - 5.11 MIL/uL   Hemoglobin 13.4 12.0 - 15.0 g/dL   HCT 62.1 30.8 - 65.7 %   MCV 91.9 80.0 - 100.0 fL   MCH  31.1 26.0 - 34.0 pg   MCHC 33.8 30.0 - 36.0 g/dL   RDW 84.6 96.2 - 95.2 %   Platelets 382 150 - 400 K/uL   nRBC 0.0 0.0 - 0.2 %  hCG, quantitative, pregnancy  Status: Abnormal   Collection Time: 07/31/22  9:24 PM  Result Value Ref Range   hCG, Beta Chain, Quant, S 49,927 (H) <5 mIU/mL       IMAGING US OB Comp Less 14 Wks  Result Date: 07/31/2022 CLINICAL DATA:  Intermittent spotting. EXAM: OBSTETRIC <14 WK [place] AND TRANSVAGINAL OB US TECHNIQUE: Both transabdominal and transvaginal ultrasound examinations were performed for complete evaluation of the gestation as well as the maternal uterus, adnexal regions, and pelvic cul-de-sac. Transvaginal technique was performed to assess early pregnancy. COMPARISON:  July 12, 2022 FINDINGS: Intrauterine gestational sac: Single Yolk sac:  Visualized. Embryo:  Visualized. Cardiac Activity: Visualized. Heart Rate: 178 bpm CRL:  17.4 mm   8 w   1 d                  [place] EDC: March 11, 2023 Subchorionic hemorrhage:  None visualized. Maternal uterus/adnexae: The right ovary is visualized and is normal in appearance. A 6.9 cm x 6.9 cm x 6.8 cm hemorrhagic left ovarian cyst is noted. No pelvic free fluid is seen. IMPRESSION: 1. Single, viable intrauterine pregnancy at approximately 8 weeks and 1 day gestation by ultrasound evaluation. 2. Large hemorrhagic left ovarian cyst. Electronically Signed   By: Aram Candela M.D.   On: 07/31/2022 22:29     MAU Management/MDM: I have reviewed the triage vital signs and the nursing notes.   Pertinent labs & imaging results that were available during my care of the patient were reviewed by me and considered in my medical decision making (see chart for details).      I have reviewed her medical records including past results, notes and treatments. Medical, Surgical, and family history were reviewed.  Medications and recent lab tests were reviewed  Ordered usual first trimester r/o ectopic labs.   Pelvic cultures  done Will check baseline Ultrasound to rule out ectopic.  This bleeding/pain can represent a normal pregnancy with bleeding, spontaneous abortion or even an ectopic which can be life-threatening.  The process as listed above helps to determine which of these is present.  Reviewed results with Dr Berton Lan and patient DIscussed cyst on ovary is hemorrhagic  Per Dr Beacher May, recommends followup in  weeks to come.  No need for treatment right now.  List of OB providers given  ASSESSMENT Single IUP at [redacted]w[redacted]d Left ovarian hemorrhagic cyst Bleeding in pregnancy  PLAN Discharge home List of OB providers given Bleeding precautions  Pt stable at time of discharge. Encouraged to return here if she develops worsening of symptoms, increase in pain, fever, or other concerning symptoms.    Wynelle Bourgeois CNM, MSN Certified Nurse-Midwife 07/31/2022  9:29 PM

## 2022-07-31 NOTE — MAU Note (Addendum)
.  Denise Woodard is a 22 y.o. at [redacted]w[redacted]d here in MAU reporting: went for an ultrasound earlier at [facility] and they saw a mass next to baby. States she has had spotting since 6/2 - was told by the nurse to come here for evaluation. Denies pain. States spotting comes and goes - had some yesterday but none today.  LMP: 06/05/2022  Pain score: 0 Vitals:   07/31/22 2048  BP: (!) 148/76  Pulse: 78  Resp: 18  Temp: 99.1 F (37.3 C)  SpO2: 98%     FHT:NA  Lab orders placed from triage:  urine pregnancy; UA

## 2022-07-31 NOTE — Discharge Instructions (Signed)
[facility] Providers   [facility]             [address], [place], [place] [address] [phone]  [facility]                                                             [address], [address], [place], [place], [address] [phone]  [facility]                                    [address], [address], [place], [place], [address] [phone]  [facility] [place] [address], [place], [place], [address] [phone]  [facility]                                 [address], [place], [place], [address] [phone]  [facility]                                    [address], [place], [place], [address] [phone]  [facility] [address], [address], [place], , [address]                              [facility] [address], [place], , [address] [phone]  [facility]         Phone: [phone]  [facility]      Phone: [phone]   [facility]      Phone: [phone]  [facility]         Phone: [phone]   [facility]    Phone: [phone]  [facility]      Phone: [phone]  [facility]     Phone: [phone]  [facility]        Phone: [phone]  [facility] (Sheronette Cousins) [phone]  [facility]      Phone: [phone]   

## 2022-08-01 LAB — GC/CHLAMYDIA PROBE AMP (~~LOC~~) NOT AT ARMC
Chlamydia: NEGATIVE
Comment: NEGATIVE
Comment: NORMAL
Neisseria Gonorrhea: NEGATIVE

## 2022-08-11 ENCOUNTER — Inpatient Hospital Stay (HOSPITAL_COMMUNITY)
Admission: AD | Admit: 2022-08-11 | Discharge: 2022-08-11 | Disposition: A | Discharge status: Home / Self Care | Payer: Medicaid Other | Attending: Obstetrics and Gynecology | Admitting: Obstetrics and Gynecology

## 2022-08-11 DIAGNOSIS — O209 Hemorrhage in early pregnancy, unspecified: Secondary | ICD-10-CM | POA: Diagnosis present

## 2022-08-11 DIAGNOSIS — Z3A09 9 weeks gestation of pregnancy: Secondary | ICD-10-CM | POA: Insufficient documentation

## 2022-08-11 DIAGNOSIS — Z3491 Encounter for supervision of normal pregnancy, unspecified, first trimester: Secondary | ICD-10-CM

## 2022-08-11 NOTE — MAU Note (Addendum)
Pt says she has been spotting since 6-2- on a pad .    Was here 2 weeks ago-  Thinks when she's stressed- she bleeds more. So today had argument with husband - has been VB.  In Triage- says put pad  on this am- mod brown bleeding .  No pain - no cramps  Has an appointment tomorrow with [facility]- in [place]

## 2022-08-11 NOTE — MAU Provider Note (Signed)
History     CSN: [id number]  Arrival date and time: 08/11/22 2143   None     Chief Complaint  Patient presents with   Vaginal Bleeding   HPI Denise Woodard is a 22 y.o. G1P0 at [redacted]w[redacted]d who presents to MAU for vaginal bleeding. This has been an ongoing issue that she has been evaluated for multiple times previously. She reports bleeding got heavier today after an argument with her husband. She reports she notices any time she is stressed out her bleeding gets heavier. She reports today the bleeding is brown with some streaks of red. She is wearing period panties however has not had to change them all day. She is not having any pain.   She is scheduled at [facility] tomorrow for her new OB appointment.   OB History     Gravida  1   Para      Term      Preterm      AB      Living         SAB      IAB      Ectopic      Multiple      Live Births              Past Medical History:  Diagnosis Date   Diabetes mellitus without complication (HCC)     No past surgical history on file.  No family history on file.  Social History   Tobacco Use   Smoking status: Never   Smokeless tobacco: Never  Vaping Use   Vaping Use: Never used  Substance Use Topics   Alcohol use: Never   Drug use: Never    Allergies:  Allergies  Allergen Reactions   Amoxicillin Other (See Comments)   Penicillins Other (See Comments)    Medications Prior to Admission  Medication Sig Dispense Refill Last Dose   acetaminophen (TYLENOL) 325 MG tablet Take 2 tablets (650 mg total) by mouth every 6 (six) hours as needed for up to 30 doses for moderate pain or headache. 30 tablet 0    Review of Systems  Genitourinary:  Positive for vaginal bleeding.  All other systems reviewed and are negative.  Physical Exam   Blood pressure (!) 156/87, pulse 92, temperature 98.8 F (37.1 C), temperature source Oral, resp. rate 16, height 5\' 6"  (1.676 m), weight 93.8 kg, last menstrual  period 06/05/2022.  Physical Exam Vitals and nursing note reviewed.  Eyes:     Extraocular Movements: Extraocular movements intact.     Pupils: Pupils are equal, round, and reactive to light.  Cardiovascular:     Rate and Rhythm: Normal rate.  Pulmonary:     Effort: Pulmonary effort is normal.  Abdominal:     Palpations: Abdomen is soft.     Tenderness: There is no abdominal tenderness.  Genitourinary:    Comments: Small amount of dark brown blood on underwear Musculoskeletal:        General: Normal range of motion.     Cervical back: Normal range of motion.  Skin:    General: Skin is warm and dry.  Neurological:     General: No focal deficit present.     Mental Status: She is alert and oriented to person, place, and time.  Psychiatric:        Mood and Affect: Mood normal.        Behavior: Behavior normal.     MAU Course  Procedures  MDM  Pt informed that the ultrasound is considered a limited OB ultrasound and is not intended to be a complete ultrasound exam.  Patient also informed that the ultrasound is not being completed with the intent of assessing for fetal or placental anomalies or any pelvic abnormalities.  Explained that the purpose of today's ultrasound is to assess for  viability.  Patient acknowledges the purpose of the exam and the limitations of the study.  FHR: 160 bpm  Assessment and Plan   1. [redacted] weeks gestation of pregnancy   2. Vaginal bleeding affecting early pregnancy   3. Normal IUP (intrauterine pregnancy) on prenatal ultrasound, first trimester    - Discharge home in stable condition - Pelvic rest. Bleeding precautions - Keep OB appointment as scheduled tomorrow 7/9  Brand Males, CNM 08/11/2022, 11:12 PM

## 2022-09-09 ENCOUNTER — Encounter: Payer: Medicaid Other | Admitting: Medical

## 2022-12-05 ENCOUNTER — Ambulatory Visit: Payer: Medicaid Other | Admitting: Family
